# Patient Record
Sex: Female | Born: 2005 | Hispanic: Yes | Marital: Single
Health system: Southern US, Community
[De-identification: ages and names within clinical notes are randomized; demographics above are authoritative.]

---
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
## 2023-04-19 ENCOUNTER — Ambulatory Visit: Payer: Medicaid Other

## 2023-04-21 ENCOUNTER — Ambulatory Visit: Payer: Medicaid Other
